# Patient Record
Sex: Male | Born: 1980 | Race: White | Hispanic: No | Marital: Married | Smoking: Current every day smoker
Health system: Southern US, Community
[De-identification: ages and names within clinical notes are randomized; demographics above are authoritative.]

## PROBLEM LIST (undated history)

## (undated) HISTORY — PX: APPENDECTOMY: SHX54

## (undated) HISTORY — PX: FOREIGN BODY REMOVAL: SHX962

## (undated) HISTORY — PX: KNEE SURGERY: SHX244

## (undated) HISTORY — PX: ELBOW SURGERY: SHX618

---

## 2011-09-23 ENCOUNTER — Ambulatory Visit
Admission: RE | Admit: 2011-09-23 | Discharge: 2011-09-23 | Disposition: A | Discharge status: Home / Self Care | Payer: Worker's Compensation | Source: Ambulatory Visit | Attending: Internal Medicine | Admitting: Internal Medicine

## 2011-09-23 ENCOUNTER — Other Ambulatory Visit: Payer: Self-pay | Admitting: Internal Medicine

## 2011-09-23 DIAGNOSIS — S53409A Unspecified sprain of unspecified elbow, initial encounter: Secondary | ICD-10-CM

## 2011-09-24 ENCOUNTER — Other Ambulatory Visit: Payer: Self-pay

## 2013-01-18 IMAGING — CT CT ELBOW*R* W/O CM
2 of 4 series · 4 of 14 positions shown, 5 images · non-contrast
Comparison: None.

CLINICAL DATA: Elbow pain and limited range of motion secondary to
an injury on 09/14/2011.  Previous fractures of the distal humerus
and proximal ulna.

CT OF THE RIGHT ELBOW WITHOUT CONTRAST
TECHNIQUE: Multidetector CT imaging was performed according to the
standard protocol. Multiplanar CT image reconstructions were also
generated.

[Series 2: elbow bone · axial · 0.32mm/px · z∈[-33,+29]mm · 2 of 77 slices shown, 3 images]
[im 26/77  soft-tissue]
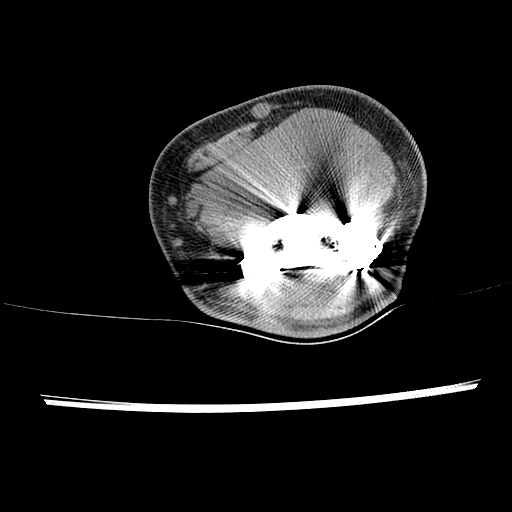
[im 26/77  bone]
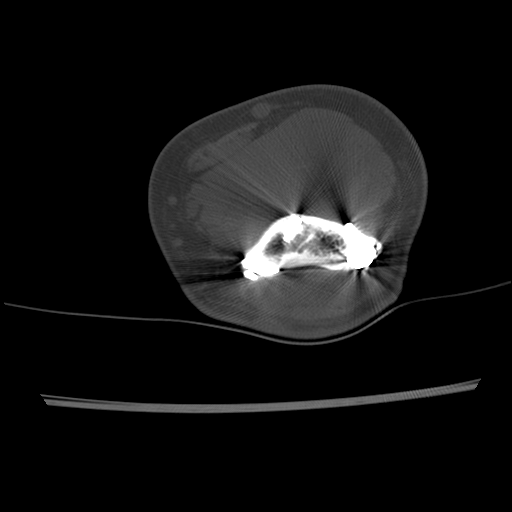
[im 51/77  bone]
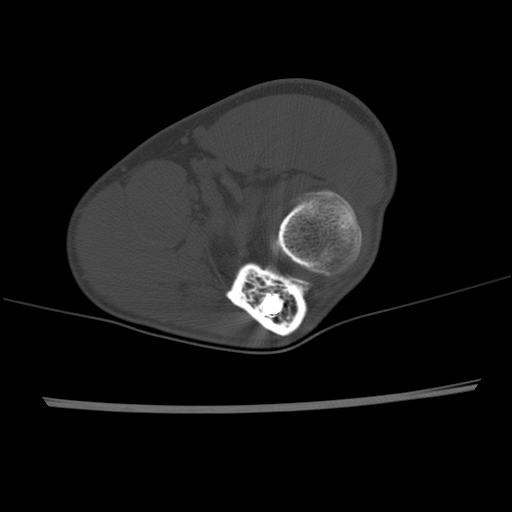

[Series 3: elbow detail · axial · 0.32mm/px · z∈[-33,+29]mm · 2 of 77 slices shown]
[im 26/77  bone]
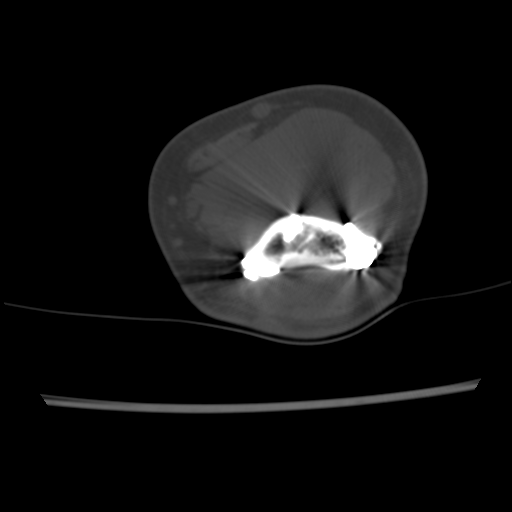
[im 51/77  bone]
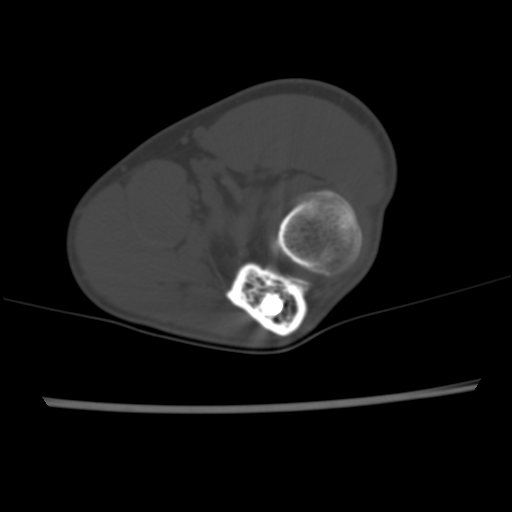

[4 of 14 positions shown; findings below may reference images not displayed]

FINDINGS: There is no acute fracture or  joint effusion.  The old
fractures are well healed.  Hardware is in good position.  There is
secondary post-traumatic arthritis with residual deformity of the
distal humerus and proximal ulna.  Calcifications are seen in the
radial collateral ligament and there are old avulsions from the
coronoid process of the proximal ulna.

The biceps and brachialis tendons are normal.  The muscles around
the elbow appear normal.  Triceps tendon is intact.
IMPRESSION: No acute abnormalities of the right elbow.  Post-traumatic
osteoarthritis.

## 2013-09-27 ENCOUNTER — Emergency Department (HOSPITAL_BASED_OUTPATIENT_CLINIC_OR_DEPARTMENT_OTHER)
Admission: EM | Admit: 2013-09-27 | Discharge: 2013-09-27 | Disposition: A | Discharge status: Home / Self Care | Payer: Worker's Compensation | Attending: Emergency Medicine | Admitting: Emergency Medicine

## 2013-09-27 ENCOUNTER — Emergency Department (HOSPITAL_BASED_OUTPATIENT_CLINIC_OR_DEPARTMENT_OTHER): Payer: Worker's Compensation

## 2013-09-27 ENCOUNTER — Encounter (HOSPITAL_BASED_OUTPATIENT_CLINIC_OR_DEPARTMENT_OTHER): Payer: Self-pay | Admitting: Emergency Medicine

## 2013-09-27 DIAGNOSIS — Y9289 Other specified places as the place of occurrence of the external cause: Secondary | ICD-10-CM | POA: Insufficient documentation

## 2013-09-27 DIAGNOSIS — Y9389 Activity, other specified: Secondary | ICD-10-CM | POA: Diagnosis not present

## 2013-09-27 DIAGNOSIS — W208XXA Other cause of strike by thrown, projected or falling object, initial encounter: Secondary | ICD-10-CM | POA: Diagnosis not present

## 2013-09-27 DIAGNOSIS — S6990XA Unspecified injury of unspecified wrist, hand and finger(s), initial encounter: Secondary | ICD-10-CM | POA: Insufficient documentation

## 2013-09-27 DIAGNOSIS — S6980XA Other specified injuries of unspecified wrist, hand and finger(s), initial encounter: Secondary | ICD-10-CM | POA: Insufficient documentation

## 2013-09-27 DIAGNOSIS — F17200 Nicotine dependence, unspecified, uncomplicated: Secondary | ICD-10-CM | POA: Diagnosis not present

## 2013-09-27 DIAGNOSIS — Y990 Civilian activity done for income or pay: Secondary | ICD-10-CM | POA: Diagnosis not present

## 2013-09-27 DIAGNOSIS — S62639A Displaced fracture of distal phalanx of unspecified finger, initial encounter for closed fracture: Secondary | ICD-10-CM

## 2013-09-27 MED ORDER — HYDROCODONE-ACETAMINOPHEN 5-325 MG PO TABS
2.0000 | ORAL_TABLET | Freq: Once | ORAL | Status: DC
  Filled 2013-09-27: qty 2

## 2013-09-27 MED ORDER — HYDROCODONE-ACETAMINOPHEN 5-325 MG PO TABS: 1.0000 | ORAL_TABLET | Freq: Four times a day (QID) | ORAL | Status: AC | PRN

## 2013-09-27 NOTE — ED Notes (Signed)
Verbal order verified with read back.

## 2013-09-27 NOTE — ED Notes (Signed)
Mashed his left 5th digit wedged in 2 pieces of metal at work. Workmans comp. Bruising noted to his nailbed

## 2013-09-27 NOTE — Discharge Instructions (Signed)

## 2013-09-27 NOTE — ED Provider Notes (Signed)
CSN: [id number]     Arrival date & time 09/27/13  1452 History   First MD Initiated Contact with Timothy Leach 09/27/13 1530     Chief Complaint  Timothy Leach presents with  . Finger Injury     (Consider location/radiation/quality/duration/timing/severity/associated sxs/prior Treatment) HPI Timothy Leach is a 33 year old male who presents to the ED after one day of finger pain. Timothy Leach states Timothy Leach was at work last night, when Timothy Leach was moving 2 pieces of metal. Timothy Leach states his "hand slipped" and Timothy Leach "mashed his finger into a slab of metal". Timothy Leach complains of pain in his left fifth distal phalanx, with some mild edema, and ecchymosis noted under the nail bed. Timothy Leach states Timothy Leach did not take anything for pain, his pain is a 10 out of 10, nothing alleviates his pain. Timothy Leach states movement and touch aggravate his pain. Timothy Leach describes the pain as a throbbing, aching, severe pain.  History reviewed. No pertinent past medical history. History reviewed. No pertinent past surgical history. No family history on file. History  Substance Use Topics  . Smoking status: Current Every Day Smoker -- 1.00 packs/day    Types: Cigarettes  . Smokeless tobacco: Not on file  . Alcohol Use: Yes    Review of Systems  Musculoskeletal: Positive for arthralgias.  Neurological: Negative for weakness and numbness.      Allergies  Review of Timothy Leach's allergies indicates no known allergies.  Home Medications   Prior to Admission medications   Medication Sig Start Date End Date Taking? Authorizing Provider  HYDROcodone-acetaminophen (NORCO/VICODIN) 5-325 MG per tablet Take 1-2 tablets by mouth every 6 (six) hours as needed. 09/27/13   Monte Fantasia, PA-C   BP 160/72  Pulse 71  Temp(Src) 98.7 F (37.1 C) (Oral)  Resp 18  Ht  (1.854 m)  Wt 175 lb (79.379 kg)  BMI 23.09 kg/m2  SpO2 100% Physical Exam  Nursing note and vitals reviewed. Constitutional: Timothy Leach appears well-developed and well-nourished. No  distress.  HENT:  Head: Normocephalic and atraumatic.  Eyes: Conjunctivae are normal. Right eye exhibits no discharge. Left eye exhibits no discharge. No scleral icterus.  Cardiovascular:  Peripheral pulses intact at injured extremity.   Pulmonary/Chest: Effort normal. No respiratory distress.  Musculoskeletal:  Ecchymosis noted under the fingernail of distal fifth phalanx. Mild edema noted. No deformity noted. Timothy Leach has limited range of motion due to to pain., However is able to move the finger. Distal sensation intact. Capillary refill less than 2 seconds distally.  Neurological: Timothy Leach is alert.  No numbness distal to injury.    Skin: Skin is warm and dry. No rash noted. Timothy Leach is not diaphoretic.  Psychiatric: Timothy Leach has a normal mood and affect.    ED Course  NAIL REMOVAL Date/Time: 09/27/2013 11:53 PM Performed by: Monte Fantasia Authorized by: Monte Fantasia Consent: Verbal consent obtained. Consent given by: Timothy Leach Timothy Leach: verbally with Timothy Leach Location: left hand Location details: left small finger Timothy Leach sedated: no Preparation: skin prepped with alcohol Nail matrix removed: none Dressing: Xeroform gauze Timothy Leach tolerance: Timothy Leach tolerated the procedure well with no immediate complications. Comments: Trephinated nail   (including critical care time) Labs Review Labs Reviewed - No data to display  Imaging Review Dg Finger Little Left  09/27/2013   CLINICAL DATA:  Crush injury to the little finger.  Bruising.  EXAM: LEFT LITTLE FINGER 2+V  COMPARISON:  None.  FINDINGS: There is a comminuted fracture of the tuft of the distal phalanx of the little finger.  Minimal displacement.  IMPRESSION: Tuft fracture.   Electronically Signed   By: Geanie Cooley M.D.   On: 09/27/2013 15:29     EKG Interpretation None      MDM   Final diagnoses:  Closed fracture of tuft of distal phalanx of finger, initial encounter    Hit hand against the metal sheet while  picking up a metal bar at work. Timothy Leach hit fifth distal phalanx of finger of left hand on metal with axial injury to fifth distal phalanx. Radiographs remarkable for a comminuted fracture of tuft of the phalanx of little finger with minimal displacement.  Pain/swelling/subungual hematoma noted. Hematoma released by trephination with cautery as noted in procedure note above, Timothy Leach's finger placed in a finger splint.  Timothy Leach discharged with pain medicine and follow up with hand surgery tomorrow.  BP 160/72  Pulse 71  Temp(Src) 98.7 F (37.1 C) (Oral)  Resp 18  Ht  (1.854 m)  Wt 175 lb (79.379 kg)  BMI 23.09 kg/m2  SpO2 100%  Signed,  Ladona Mow, PA-C 11:21 PM  Pt seen and discussed with Dr. Elwin Mocha, MD     Monte Fantasia, PA-C 09/27/13 2321  Monte Fantasia, PA-C 09/27/13 2355

## 2013-09-28 NOTE — ED Provider Notes (Signed)
Medical screening examination/treatment/procedure(s) were conducted as a shared visit with non-physician practitioner(s) and myself.  I personally evaluated the patient during the encounter.   EKG Interpretation None      I evaluated patient with Ladona Mow, PA-C. Trephinated for subungual hematoma. Splinted for tuft fracture.  Elwin Mocha, MD 09/28/13 [phone]

## 2015-01-23 IMAGING — CR DG FINGER LITTLE 2+V*L*
3 series · 3 of 3 positions shown · non-contrast
Comparison: None.

CLINICAL DATA: Crush injury to the little finger.  Bruising.

EXAM:
LEFT LITTLE FINGER 2+V

[x finger pa left]
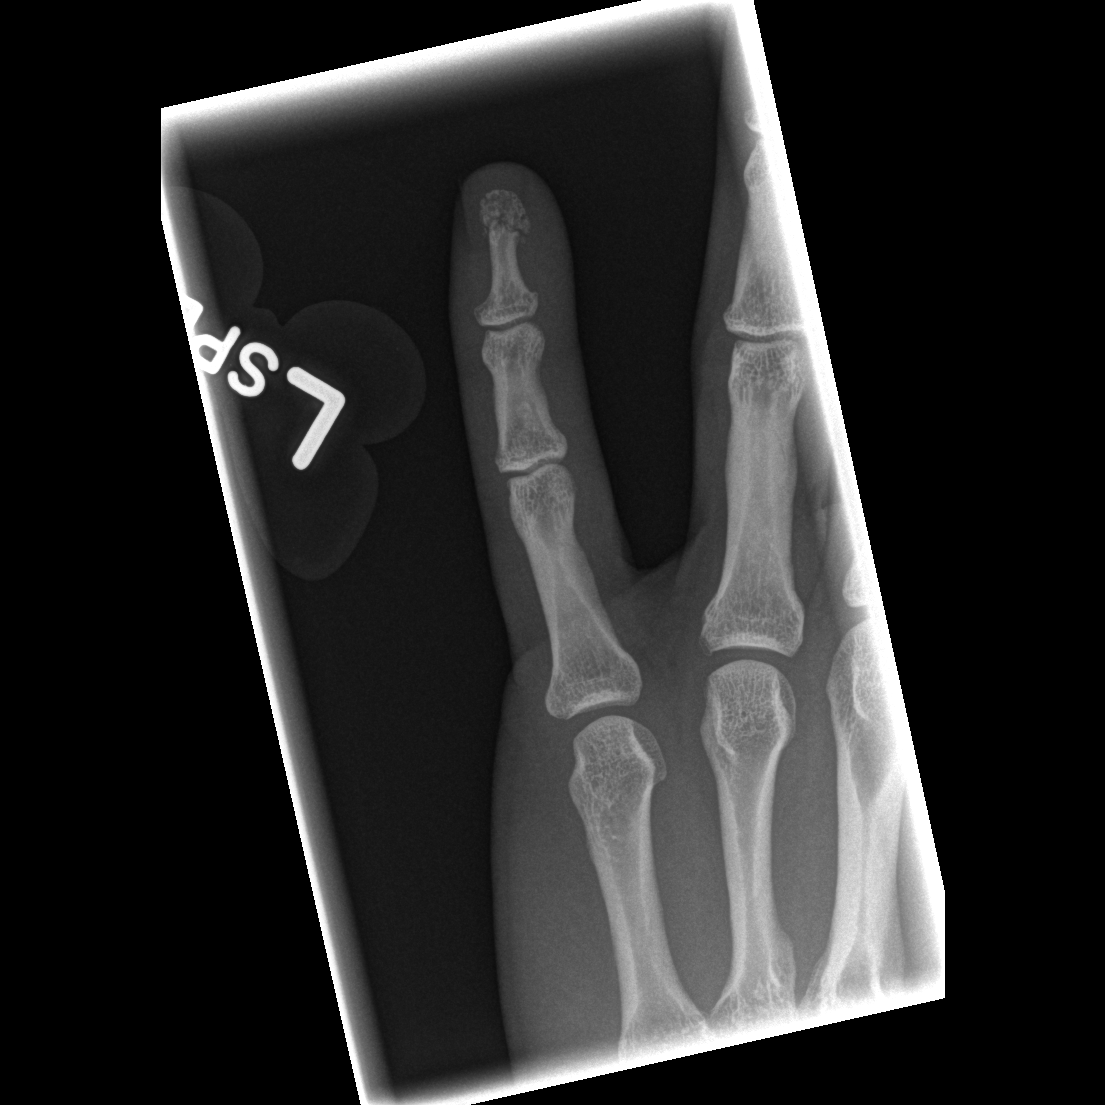

[x finger obl. left]
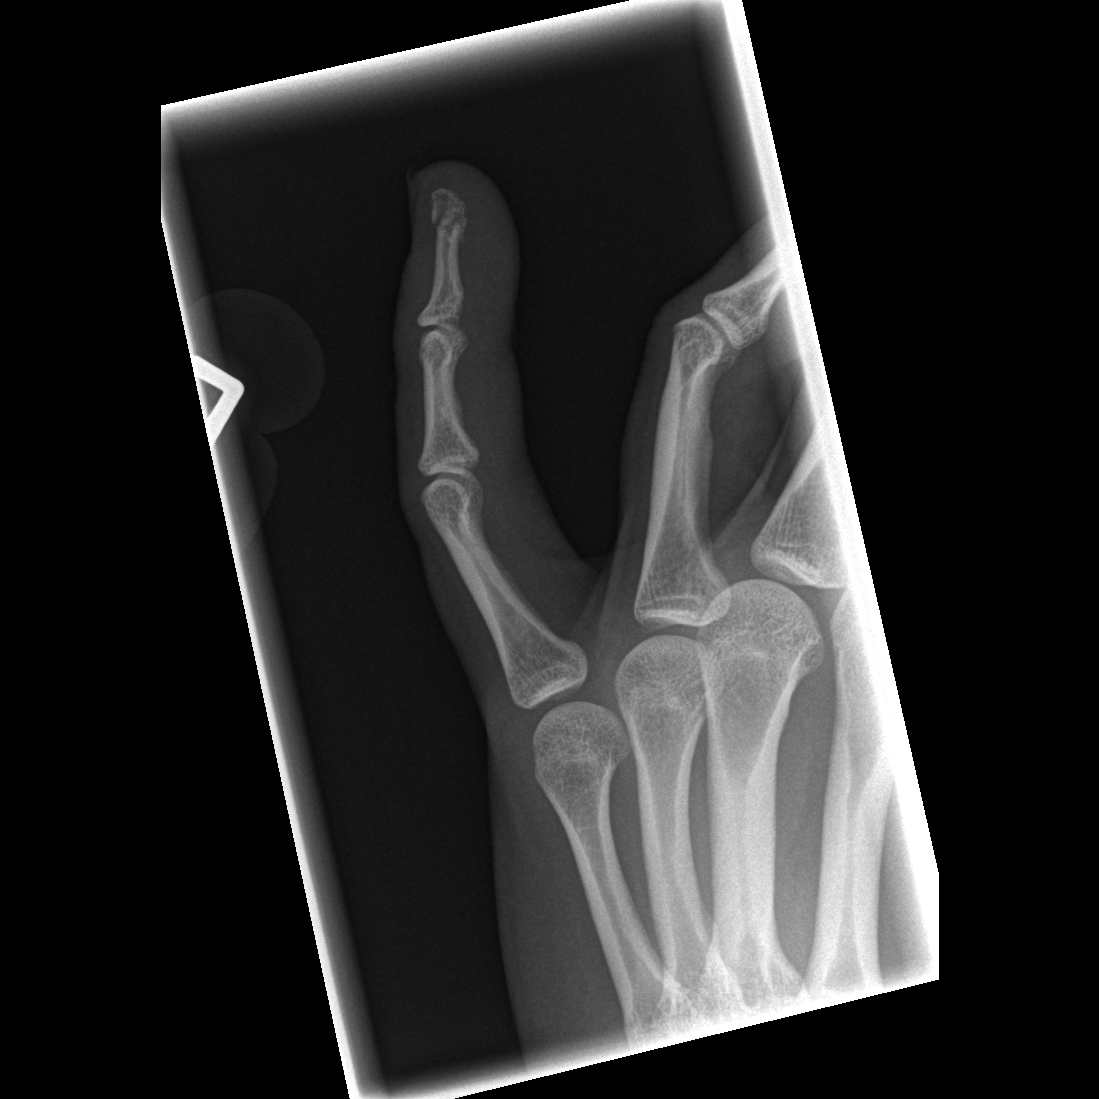

[x finger lateral left]
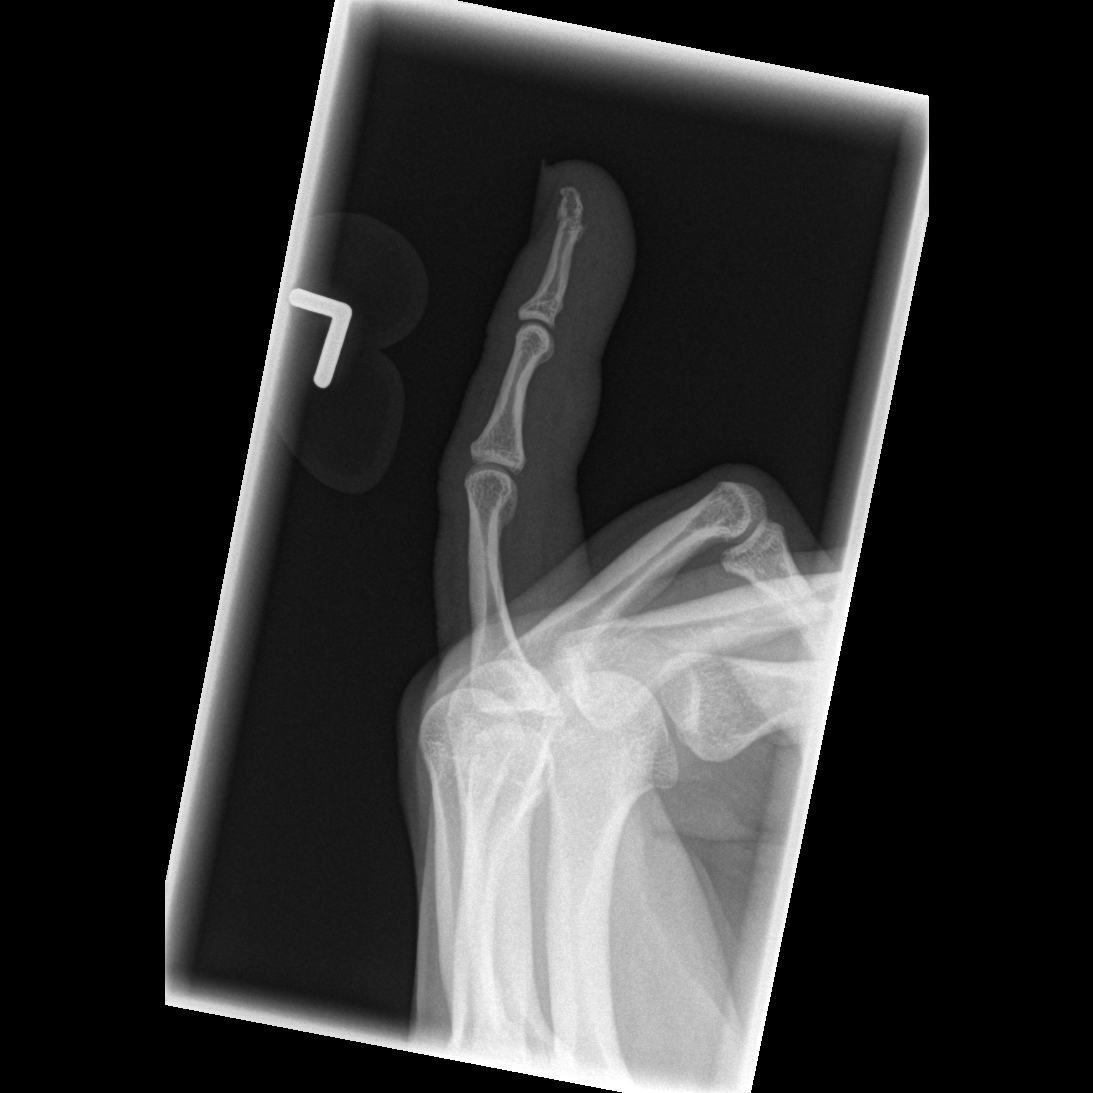

[3 of 3 positions shown; findings below may reference images not displayed]

FINDINGS: There is a comminuted fracture of the tuft of the distal phalanx of
the little finger. Minimal displacement.
IMPRESSION: Tuft fracture.

## 2015-10-02 ENCOUNTER — Emergency Department (HOSPITAL_COMMUNITY)
Admission: EM | Admit: 2015-10-02 | Discharge: 2015-10-03 | Disposition: A | Discharge status: Home / Self Care | Payer: Worker's Compensation | Attending: Emergency Medicine | Admitting: Emergency Medicine

## 2015-10-02 ENCOUNTER — Encounter (HOSPITAL_COMMUNITY): Payer: Self-pay | Admitting: Emergency Medicine

## 2015-10-02 DIAGNOSIS — Z79899 Other long term (current) drug therapy: Secondary | ICD-10-CM | POA: Diagnosis not present

## 2015-10-02 DIAGNOSIS — Y990 Civilian activity done for income or pay: Secondary | ICD-10-CM | POA: Diagnosis not present

## 2015-10-02 DIAGNOSIS — S51012A Laceration without foreign body of left elbow, initial encounter: Secondary | ICD-10-CM

## 2015-10-02 DIAGNOSIS — W268XXA Contact with other sharp object(s), not elsewhere classified, initial encounter: Secondary | ICD-10-CM | POA: Insufficient documentation

## 2015-10-02 DIAGNOSIS — Y9289 Other specified places as the place of occurrence of the external cause: Secondary | ICD-10-CM | POA: Diagnosis not present

## 2015-10-02 DIAGNOSIS — Z23 Encounter for immunization: Secondary | ICD-10-CM | POA: Insufficient documentation

## 2015-10-02 DIAGNOSIS — Y9389 Activity, other specified: Secondary | ICD-10-CM | POA: Insufficient documentation

## 2015-10-02 DIAGNOSIS — F17210 Nicotine dependence, cigarettes, uncomplicated: Secondary | ICD-10-CM | POA: Diagnosis not present

## 2015-10-02 MED ORDER — LIDOCAINE-EPINEPHRINE (PF) 1 %-1:200000 IJ SOLN
INTRAMUSCULAR | Status: AC
  Administered 2015-10-02: 30 mL
  Filled 2015-10-02: qty 30

## 2015-10-02 MED ORDER — LIDOCAINE-EPINEPHRINE (PF) 2 %-1:200000 IJ SOLN
10.0000 mL | Freq: Once | INTRAMUSCULAR | Status: DC
  Filled 2015-10-02: qty 20

## 2015-10-02 MED ORDER — TETANUS-DIPHTH-ACELL PERTUSSIS 5-2.5-18.5 LF-MCG/0.5 IM SUSP
0.5000 mL | Freq: Once | INTRAMUSCULAR | Status: AC
  Administered 2015-10-02: 0.5 mL via INTRAMUSCULAR
  Filled 2015-10-02: qty 0.5

## 2015-10-02 NOTE — Progress Notes (Addendum)
Pt states he injured his left elbow at work. He is requesting a drug test for work. The lab has been notified. Laceration to left elbow about 2 inches in length. Pt seen by PA (11:10pm)Report to oncoming shift.

## 2015-10-02 NOTE — ED Triage Notes (Signed)
Pt states that he was working in a [employer], [employer] and hit his L elbow on a sharp edge. States that he does not know when his last tetanus shot was. Bleeding mostly controlled. Bandage applied in triage. Alert and oriented.

## 2015-10-06 NOTE — ED Provider Notes (Signed)
[facility]-EMERGENCY DEPT Provider Note   CSN: [id number] Arrival date & time: 10/02/15  2047     History   Chief Complaint Chief Complaint  Patient presents with  . Extremity Laceration    HPI Timothy Leach is a 35 y.o. male with no significant pmhx who presents to the ED today c/o laceration. Pt states that around 4 hours ago he was at work in a [employer], early years/pre and accidentally hit his left elbow on s sharp edge. Pt has pain around laceration site. Last tetanus unknown. Bleeding controlled.  HPI  History reviewed. No pertinent past medical history.  There are no active problems to display for this patient.   Past Surgical History:  Procedure Laterality Date  . APPENDECTOMY    . ELBOW SURGERY    . FOREIGN BODY REMOVAL Right    BB in elbow R  . KNEE SURGERY Left    tumor removal       Home Medications    Prior to Admission medications   Medication Sig Start Date End Date Taking? Authorizing Provider  HYDROcodone-acetaminophen (NORCO/VICODIN) 5-325 MG per tablet Take 1-2 tablets by mouth every 6 (six) hours as needed. 09/27/13   Ladona Mow, PA-C    Family History History reviewed. No pertinent family history.  Social History Social History  Substance Use Topics  . Smoking status: Current Every Day Smoker    Packs/day: 1.00    Types: Cigarettes  . Smokeless tobacco: Not on file  . Alcohol use Yes     Allergies   Review of patient's allergies indicates no known allergies.   Review of Systems Review of Systems  All other systems reviewed and are negative.    Physical Exam Updated Vital Signs BP 152/90 (BP Location: Right Arm)   Pulse 74   Temp 98.3 F (36.8 C) (Oral)   Resp 18   SpO2 100%   Physical Exam  Constitutional: He is oriented to person, place, and time. He appears well-developed and well-nourished. No distress.  HENT:  Head: Normocephalic and atraumatic.  Eyes: Conjunctivae are normal. Right eye exhibits no discharge. Left eye exhibits no  discharge. No scleral icterus.  Cardiovascular: Normal rate and intact distal pulses.   Pulmonary/Chest: Effort normal.  Musculoskeletal: Normal range of motion. He exhibits tenderness ( over left lateral epicondyle). He exhibits no edema or deformity.  Neurological: He is alert and oriented to person, place, and time. Coordination normal.  Normal sensation  Skin: Skin is warm and dry. No rash noted. He is not diaphoretic. No erythema. No pallor.  2cm horizontal laceration superior to left lateral epicondyle  Psychiatric: He has a normal mood and affect. His behavior is normal.  Nursing note and vitals reviewed.    ED Treatments / Results  Labs (all labs ordered are listed, but only abnormal results are displayed) Labs Reviewed - No data to display  EKG  EKG Interpretation None       Radiology No results found.  Procedures Procedures (including critical care time)  LACERATION REPAIR Performed by: Dub Mikes Authorized by: Dub Mikes Consent: Verbal consent obtained. Risks and benefits: risks, benefits and alternatives were discussed Consent given by: patient Patient identity confirmed: provided demographic data Prepped and Draped in normal sterile fashion Wound explored  Laceration Location: left elbow  Laceration Length: 2cm  No Foreign Bodies seen or palpated  Anesthesia: local infiltration  Local anesthetic: lidocaine 1% with epinephrine  Anesthetic total: 2 ml  Irrigation method: syringe Amount of cleaning: standard  Skin closure: approximated  Number of sutures: 3  Technique: simple interrupted  Patient tolerance: Patient tolerated the procedure well with no immediate complications.   Medications Ordered in ED Medications  Tdap (BOOSTRIX) injection 0.5 mL (0.5 mLs Intramuscular Given 10/02/15 2319)  lidocaine-EPINEPHrine (XYLOCAINE-EPINEPHrine) 1 %-1:200000 (PF) injection (30 mLs  Given by Other 10/02/15 2322)      Initial Impression / Assessment and Plan / ED Course  I have reviewed the triage vital signs and the nursing notes.  Pertinent labs & imaging results that were available during my care of the patient were reviewed by me and considered in my medical decision making (see chart for details).  Clinical Course    Tdap booster given.Pressure irrigation performed. Laceration occurred < 8 hours prior to repair which was well tolerated. Pt has no co morbidities to effect normal wound healing. Discussed suture home care w pt and answered questions. Pt to f-u for wound check and suture removal in 7 days. Pt is hemodynamically stable w no complaints prior to dc.     Final Clinical Impressions(s) / ED Diagnoses   Final diagnoses:  Elbow laceration, left, initial encounter    New Prescriptions Discharge Medication List as of 10/03/2015 12:42 AM       Dub MikesSamantha Tripp Teo Moede, PA-C 10/06/15 1631    Arby BarretteMarcy Pfeiffer, MD 10/07/15 [phone]
